# Patient Record
Sex: Male | Born: 1940 | Race: White | Hispanic: No | Marital: Married | Smoking: Former smoker
Health system: Southern US, Community
[De-identification: ages and names within clinical notes are randomized; demographics above are authoritative.]

## PROBLEM LIST (undated history)

## (undated) DIAGNOSIS — G459 Transient cerebral ischemic attack, unspecified: Secondary | ICD-10-CM

## (undated) DIAGNOSIS — I639 Cerebral infarction, unspecified: Secondary | ICD-10-CM

## (undated) DIAGNOSIS — R569 Unspecified convulsions: Secondary | ICD-10-CM

## (undated) DIAGNOSIS — I619 Nontraumatic intracerebral hemorrhage, unspecified: Secondary | ICD-10-CM

## (undated) DIAGNOSIS — E119 Type 2 diabetes mellitus without complications: Secondary | ICD-10-CM

## (undated) DIAGNOSIS — I519 Heart disease, unspecified: Secondary | ICD-10-CM

## (undated) HISTORY — PX: CHOLECYSTECTOMY: SHX55

## (undated) HISTORY — DX: Type 2 diabetes mellitus without complications: E11.9

## (undated) HISTORY — DX: Nontraumatic intracerebral hemorrhage, unspecified: I61.9

## (undated) HISTORY — DX: Heart disease, unspecified: I51.9

## (undated) HISTORY — DX: Cerebral infarction, unspecified: I63.9

## (undated) HISTORY — DX: Transient cerebral ischemic attack, unspecified: G45.9

## (undated) HISTORY — DX: Unspecified convulsions: R56.9

---

## 1980-01-18 HISTORY — PX: CEREBRAL ANEURYSM REPAIR: SHX164

## 1994-01-17 HISTORY — PX: AORTIC VALVE REPLACEMENT (AVR)/CORONARY ARTERY BYPASS GRAFTING (CABG): SHX5725

## 1996-01-18 HISTORY — PX: CORONARY ANGIOPLASTY WITH STENT PLACEMENT: SHX49

## 2015-12-22 ENCOUNTER — Encounter: Payer: Self-pay | Admitting: Neurology

## 2015-12-22 ENCOUNTER — Ambulatory Visit (INDEPENDENT_AMBULATORY_CARE_PROVIDER_SITE_OTHER): Payer: Medicare Other | Admitting: Neurology

## 2015-12-22 VITALS — BP 150/82 | HR 60 | Ht 70.0 in | Wt 203.5 lb

## 2015-12-22 DIAGNOSIS — Z5181 Encounter for therapeutic drug level monitoring: Secondary | ICD-10-CM

## 2015-12-22 DIAGNOSIS — R4701 Aphasia: Secondary | ICD-10-CM | POA: Diagnosis not present

## 2015-12-22 NOTE — Progress Notes (Signed)
Reason for visit: Episodic aphasia  Referring physician: Dr. Leone Brand is a 75 y.o. male  History of present illness:  Mr. Enyeart is a 75 year old right-handed white male with a history of a cerebral hemorrhage requiring surgery around 1982. The patient has a surgical clip in place and he cannot undergo MRI brain evaluation. The patient has begun having episodes of stereotyped aphasia events that began in January 2017. The prior medical records from [place] were reviewed. He was admitted initially on January 6, again on July 26 and again on October 19 for episodes where he was unable to speak, he looked wide eyed, drooling, and stiff in the extremities. The patient is able to understand what is going on during the events, but he has a sensation of impending doom. The patient feels lack of control of the body and has black spots in front of the vision. The episode will last about an hour with recovery. The patient has had some general decline in his functional level since January 2017. 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 A 2-D echocardiogram showed an ejection fraction greater than 55% without a source of cardiogenic embolus. An EEG study done on 08/12/2015 showed right temporal sharp wave activity. The patient has been on Keppra which she could not tolerate and then was switched to Dilantin. A Dilantin level has not been checked at a 300 mg daily dose. 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 He has noted no new numbness or weakness. He is sent to this office or an evaluation. He has a chronic gait disorder, he will fall on occasion, uses a walker  for ambulation.  Past Medical History:  Diagnosis Date  . Cerebral hemorrhage (HCC)   . Diabetes (HCC)   . Heart disease   . TIA (transient ischemic attack)     Past Surgical History:  Procedure Laterality Date  . 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 CEREBRAL ANEURYSM REPAIR  1982  . CHOLECYSTECTOMY    . CORONARY ANGIOPLASTY WITH STENT PLACEMENT  1998    Family History  Problem Relation Age of Onset  . Hodgkin's lymphoma Mother   . Stroke Father     Social history:  reports that he quit smoking about 21 years ago. He has never used smokeless tobacco. He reports that he does not drink alcohol or use drugs.  Medications:  Prior to Admission medications   Medication Sig Start Date End Date Taking? Authorizing Provider  amLODipine (NORVASC) 10 MG tablet Take by mouth.   Yes Historical Provider, MD  aspirin EC 81 MG tablet Take by mouth.   Yes Historical Provider, MD  Blood Glucose Monitoring Suppl DEVI ACCU CHEK AVIVA PLUS METER   Dx E11.9 03/11/15  Yes Historical Provider, MD  clindamycin (CLEOCIN) 300 MG capsule Take by mouth.   Yes Historical Provider, MD  clopidogrel (PLAVIX) 75 MG tablet Take 1 tablet by mouth  every day 10/12/15  Yes Historical Provider, MD  gabapentin (NEURONTIN) 100 MG capsule  12/14/15  Yes Historical Provider, MD  Glucose Blood (BLOOD GLUCOSE TEST STRIPS) STRP ACCU CHEK AVIVA  TEST STRIPS USE TID Use as directed   Dx E11.9 03/11/15 03/10/16 Yes Historical Provider, MD  HUMULIN 70/30 (70-30) 100 UNIT/ML injection  11/27/15  Yes Historical Provider, MD  HYDROcodone-acetaminophen (NORCO) 10-325 MG tablet Take by mouth.   Yes Historical Provider, MD  Insulin Syringe-Needle U-100 (INSULIN SYRINGE .3CC/31GX5/16") 31G X 5/16" 0.3 ML MISC USE TWICE DAILY AS DIRECTED 11/26/15  Yes Historical Provider, MD  ipratropium (ATROVENT HFA) 17 MCG/ACT inhaler Inhale into the lungs.   Yes Historical Provider, MD  isosorbide  mononitrate (IMDUR) 60 MG 24 hr tablet Take by mouth.   Yes Historical Provider, MD  Lancets (ACCU-CHEK SOFT TOUCH) lancets Use TID as directed.   Dx E11.9 03/11/15 03/10/16 Yes Historical Provider, MD  meloxicam (MOBIC) 7.5 MG tablet Take by mouth.   Yes Historical Provider, MD  metoprolol tartrate (LOPRESSOR) 25 MG tablet Take by mouth.   Yes Historical Provider, MD  nitroGLYCERIN (NITROSTAT) 0.4 MG SL tablet Place under the tongue.   Yes Historical Provider, MD  phenytoin (DILANTIN) 100 MG ER capsule Take by mouth. 12/04/15 12/03/16 Yes Historical Provider, MD  potassium chloride (K-DUR) 10 MEQ tablet Take by mouth.   Yes Historical Provider, MD  pravastatin (PRAVACHOL) 40 MG tablet Take by mouth.   Yes Historical Provider, MD  ranitidine (ZANTAC) 150 MG tablet Take 1 tablet by mouth  twice a day 10/12/15  Yes Historical Provider, MD  tamsulosin (FLOMAX) 0.4 MG CAPS capsule Take 1 capsule by mouth  once a day 10/12/15  Yes Historical Provider, MD  traZODone (DESYREL) 50 MG tablet Take 2 tablets by mouth  every night at bedtime 10/12/15  Yes Historical Provider, MD  trolamine salicylate (ASPERCREME) 10 % cream Apply topically.   Yes Historical Provider, MD     No Known Allergies  ROS:  Out of a complete 14 system review of symptoms, the patient complains only of the following symptoms, and all other reviewed systems are negative.  Weight loss, fatigue Ringing in the ears, trouble swallowing Shortness of breath Incontinence of bowel and bladder, diarrhea Easy bruising, easy bleeding Feeling hot, increased thirst Joint pain Memory loss, confusion, headache, numbness, weakness, slurred speech, difficulty swallowing, seizure events Depression, anxiety, decreased energy, change in appetite, disinterest in activities  Blood pressure (!) 150/82, pulse 60, height 5\' 10"  (1.778 m), weight 203 lb 8 oz (92.3 kg).  Physical Exam  General: The patient is alert and cooperative at the time of the  examination. The patient is moderately obese  Eyes: Pupils are equal, round, and reactive to light. Discs are flat bilaterally.  Neck: The neck is supple, no carotid bruits are noted.  Respiratory: The respiratory examination is clear.  Cardiovascular: The cardiovascular examination reveals a regular rate and rhythm, no obvious murmurs or rubs are noted.  Skin: Extremities are without significant edema. There is some atrophy of the left arm and left leg, left arm is in flexion.  Neurologic Exam  Mental status: The patient is alert and oriented x 3 at the time of the examination. The patient has apparent normal recent and remote memory, with an apparently normal attention span and concentration ability.  Cranial nerves: Facial symmetry is not present. There is some depression of the left nasolabial fold. There is good sensation of the face to pinprick and soft touch on the right face, decreased on the left. The strength of the facial muscles and the muscles to head turning and shoulder shrug are normal bilaterally. Speech is well enunciated, no aphasia  or dysarthria is noted. Extraocular movements are full. Visual fields are full. The tongue is midline, and the patient has symmetric elevation of the soft palate. No obvious hearing deficits are noted.  Motor: The motor testing reveals 5 over 5 strength of the right extremities. On the left side, there is flexion of the left arm and elbow, flexion of the fingers. There is 4/5 strength with grip and with biceps and triceps function. The left lower extremity has some increased motor tone, normal or near-normal strength.  Sensory: Sensory testing is intact to pinprick, soft touch, vibration sensation, and position sense on the right extremities, with some decreased pinprick sensation on the left arm. Position sensation is somewhat decreased in both feet. No evidence of extinction is noted.  Coordination: Cerebellar testing reveals good  finger-nose-finger and heel-to-shin on the right, the patient has difficulty performing these tasks on the left arm and leg.  Gait and station: Gait is slightly wide-based, circumduction gait with the left leg, the patient walks with a walker with a slow deliberate gait. Tandem gait was not attempted Romberg is negative. No drift is seen.  Reflexes: Deep tendon reflexes are symmetric and normal bilaterally. Toes are downgoing on the right, upgoing on the left.   Assessment/Plan:  1. History of right intracranial hemorrhage, left hemiparesis and gait disorder  2. Episodic aphasia, possible seizures  The patient has had a multitude of events of aphasia; since being out of the hospital in October 2017, the patient has had 4 events at home, and he did not go to the hospital on the 12th and 30th of November, and the first and fourth of December. The patient has had a total of 7 such events this year. The events are identical from one episode to the other, increasing the likelihood that these do represent seizures. The patient has had an abnormal EEG evaluation. Blood work will be done today, if renal function allows, we will check a CT angiogram of the head and neck. The patient will have an EEG study repeated. Adjustments of the Dilantin may be done, or another seizure medication may be added to the Dilantin. He will follow-up in 3 months.  Marlan Palau. Keith Yusra Ravert MD 12/22/2015 2:48 PM  [facility] [address] [place] [id number]  Phone [phone] Fax [phone]

## 2015-12-22 NOTE — Patient Instructions (Signed)
   We will check blood work today and get an EEG study.

## 2015-12-23 ENCOUNTER — Telehealth: Payer: Self-pay | Admitting: Neurology

## 2015-12-23 DIAGNOSIS — G451 Carotid artery syndrome (hemispheric): Secondary | ICD-10-CM

## 2015-12-23 LAB — CBC WITH DIFFERENTIAL/PLATELET
BASOS ABS: 0 10*3/uL (ref 0.0–0.2)
Basos: 0 %
EOS (ABSOLUTE): 0.1 10*3/uL (ref 0.0–0.4)
Eos: 1 %
HEMOGLOBIN: 16.2 g/dL (ref 13.0–17.7)
Hematocrit: 47.6 % (ref 37.5–51.0)
Immature Grans (Abs): 0.1 10*3/uL (ref 0.0–0.1)
Immature Granulocytes: 1 %
LYMPHS ABS: 1.8 10*3/uL (ref 0.7–3.1)
Lymphs: 17 %
MCH: 30.7 pg (ref 26.6–33.0)
MCHC: 34 g/dL (ref 31.5–35.7)
MCV: 90 fL (ref 79–97)
MONOCYTES: 7 %
MONOS ABS: 0.7 10*3/uL (ref 0.1–0.9)
NEUTROS ABS: 7.5 10*3/uL — AB (ref 1.4–7.0)
Neutrophils: 74 %
Platelets: 227 10*3/uL (ref 150–379)
RBC: 5.28 x10E6/uL (ref 4.14–5.80)
RDW: 14.7 % (ref 12.3–15.4)
WBC: 10.2 10*3/uL (ref 3.4–10.8)

## 2015-12-23 LAB — COMPREHENSIVE METABOLIC PANEL
A/G RATIO: 1.9 (ref 1.2–2.2)
ALBUMIN: 4.5 g/dL (ref 3.5–4.8)
ALK PHOS: 96 IU/L (ref 39–117)
ALT: 13 IU/L (ref 0–44)
AST: 18 IU/L (ref 0–40)
BILIRUBIN TOTAL: 0.4 mg/dL (ref 0.0–1.2)
BUN / CREAT RATIO: 16 (ref 10–24)
BUN: 21 mg/dL (ref 8–27)
CHLORIDE: 102 mmol/L (ref 96–106)
CO2: 18 mmol/L (ref 18–29)
Calcium: 9 mg/dL (ref 8.6–10.2)
Creatinine, Ser: 1.33 mg/dL — ABNORMAL HIGH (ref 0.76–1.27)
GFR calc non Af Amer: 52 mL/min/{1.73_m2} — ABNORMAL LOW (ref >59)
GFR, EST AFRICAN AMERICAN: 60 mL/min/{1.73_m2} (ref >59)
GLOBULIN, TOTAL: 2.4 g/dL (ref 1.5–4.5)
Glucose: 209 mg/dL — ABNORMAL HIGH (ref 65–99)
POTASSIUM: 5.1 mmol/L (ref 3.5–5.2)
SODIUM: 136 mmol/L (ref 134–144)
TOTAL PROTEIN: 6.9 g/dL (ref 6.0–8.5)

## 2015-12-23 LAB — PHENYTOIN LEVEL, TOTAL: PHENYTOIN (DILANTIN), SERUM: 2.6 ug/mL — AB (ref 10.0–20.0)

## 2015-12-23 MED ORDER — PHENYTOIN SODIUM EXTENDED 100 MG PO CAPS: 200.0000 mg | ORAL_CAPSULE | Freq: Two times a day (BID) | ORAL | 3 refills | Status: AC

## 2015-12-23 NOTE — Telephone Encounter (Signed)
I called patient. I talk with the wife. The Dilantin level is low at 2.6. The dose will be increased taking 200 mg twice daily, I will call the patient in 2 weeks to recheck the Dilantin level. I will order a CT angiogram of the head and neck. The GFR is 52. No allergies to iodine or shellfish.  I will call in a prescription or the Dilantin to cover the increase dose.

## 2016-01-01 ENCOUNTER — Other Ambulatory Visit: Payer: Self-pay

## 2016-01-04 ENCOUNTER — Ambulatory Visit
Admission: RE | Admit: 2016-01-04 | Discharge: 2016-01-04 | Disposition: A | Discharge status: Home / Self Care | Payer: Medicare Other | Source: Ambulatory Visit | Attending: Neurology | Admitting: Neurology

## 2016-01-04 ENCOUNTER — Telehealth: Payer: Self-pay | Admitting: Neurology

## 2016-01-04 ENCOUNTER — Other Ambulatory Visit: Payer: Self-pay

## 2016-01-04 DIAGNOSIS — G451 Carotid artery syndrome (hemispheric): Secondary | ICD-10-CM

## 2016-01-04 IMAGING — CT CT ANGIO HEAD
1 of 10 series · 16 of 47 positions shown · IV contrast (isovue)
Comparison: None.

CLINICAL DATA: 75 y/o M; transient a aphasia. History of left-sided
stroke and cerebral aneurysm.

EXAM:
CT ANGIOGRAPHY HEAD AND NECK
TECHNIQUE: Multidetector CT imaging of the head and neck was performed using
the standard protocol during bolus administration of intravenous
contrast. Multiplanar CT image reconstructions and MIPs were
obtained to evaluate the vascular anatomy. Carotid stenosis
measurements (when applicable) are obtained utilizing NASCET
criteria, using the distal internal carotid diameter as the
denominator.
CONTRAST:  80 cc of Isovue 370.

[Series 10: thin · axial · 0.59mm/px · z∈[-650,-266]mm · 16 of 1434 slices shown]
[im 76/1434  brain]
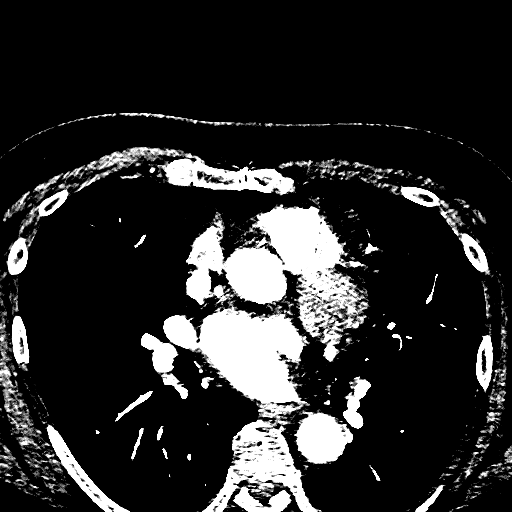
[im 151/1434  bone]
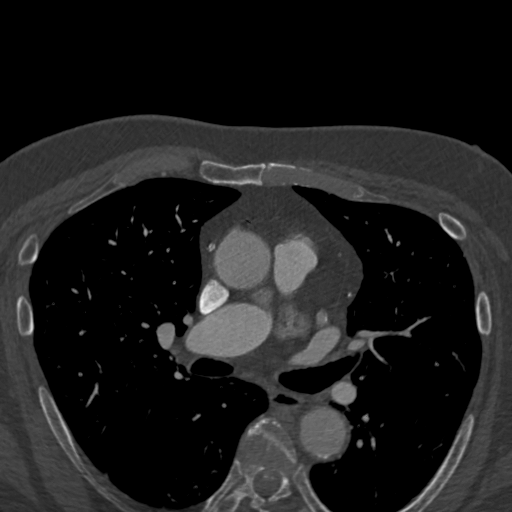
[im 227/1434  brain]
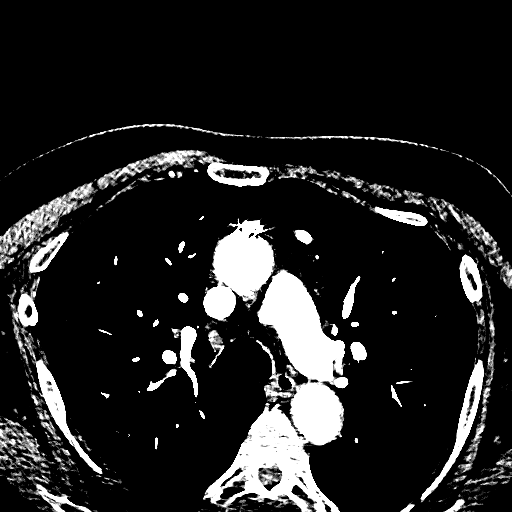
[im 302/1434  bone]
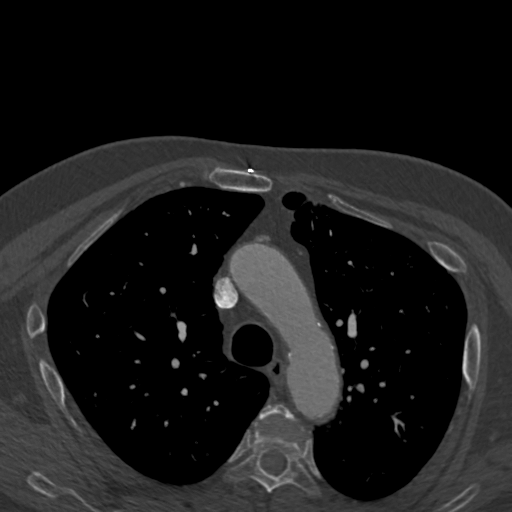
[im 453/1434  brain]
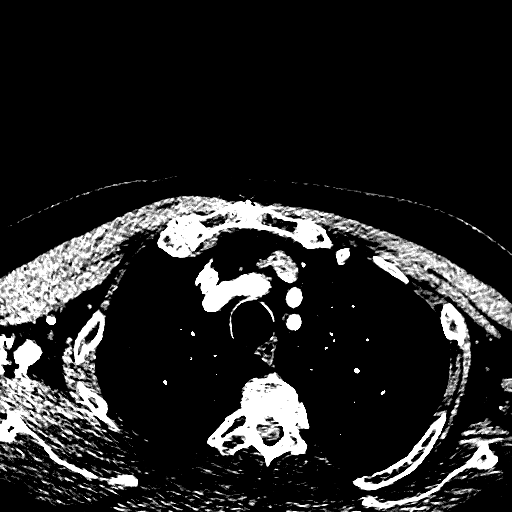
[im 528/1434  bone]
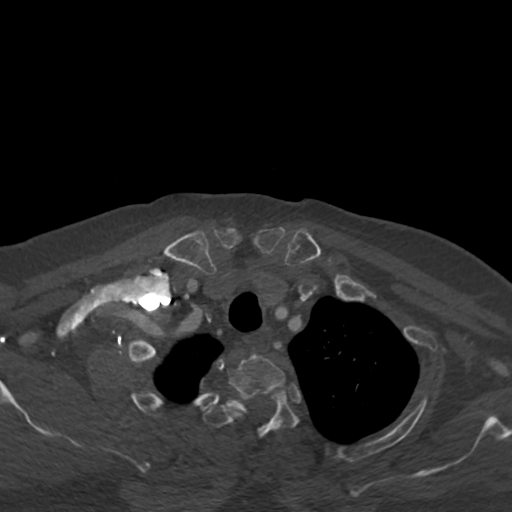
[im 604/1434  brain]
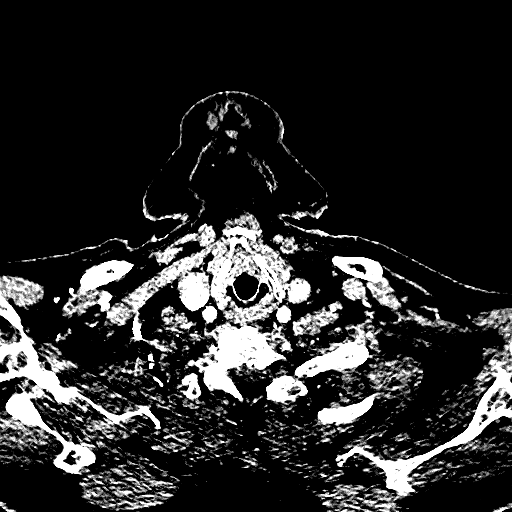
[im 679/1434  bone]
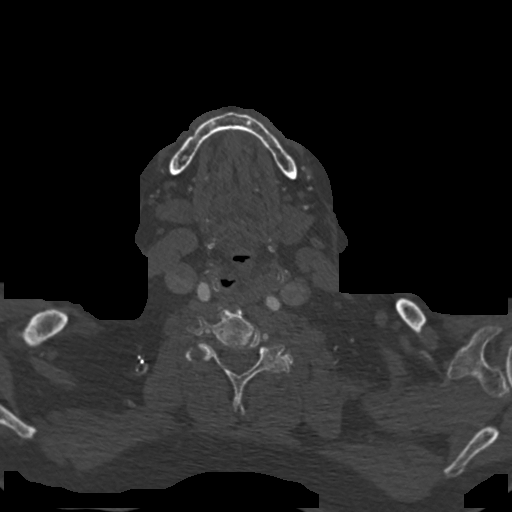
[im 755/1434  brain]
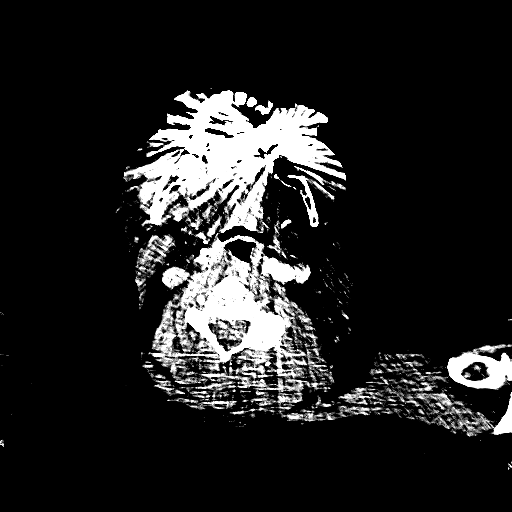
[im 830/1434  bone]
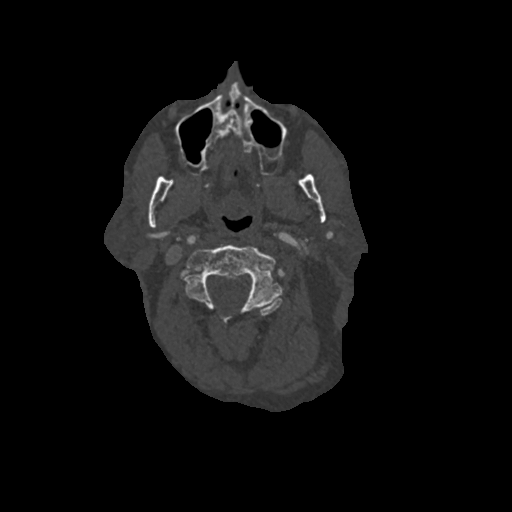
[im 906/1434  brain]
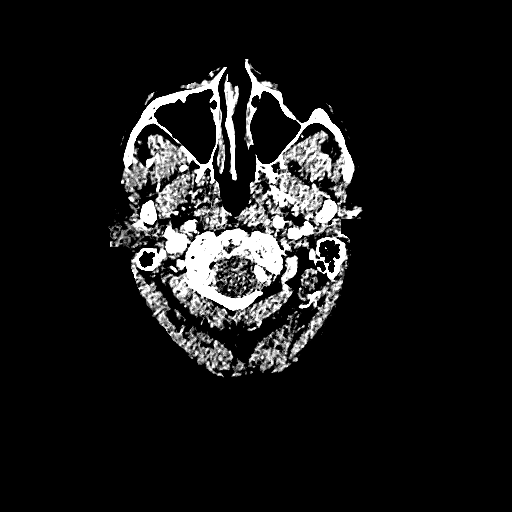
[im 981/1434  bone]
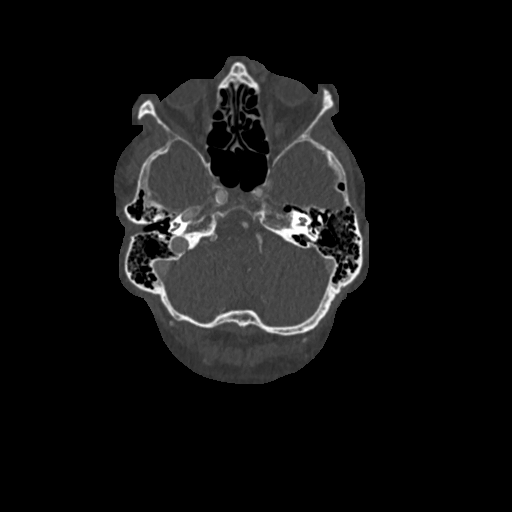
[im 1132/1434  brain]
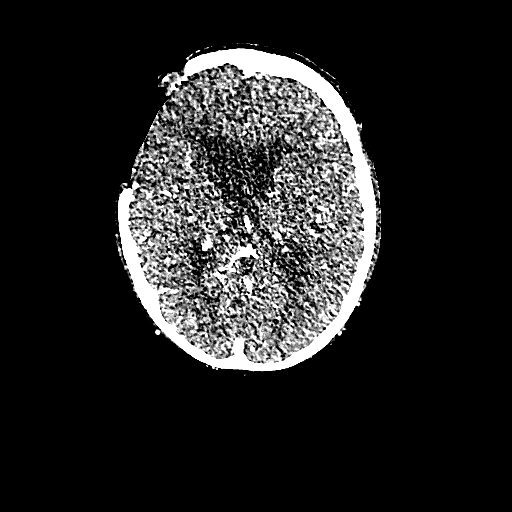
[im 1207/1434  bone]
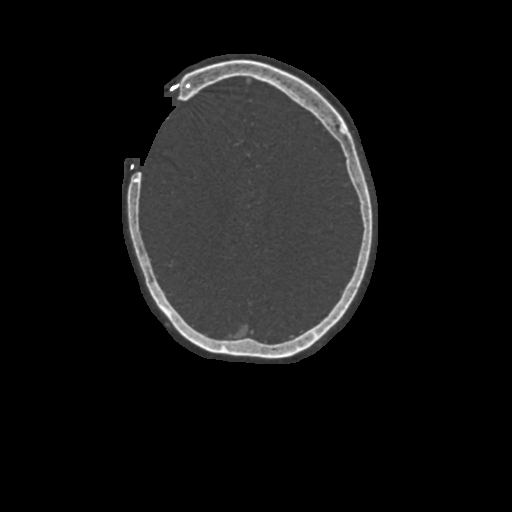
[im 1283/1434  brain]
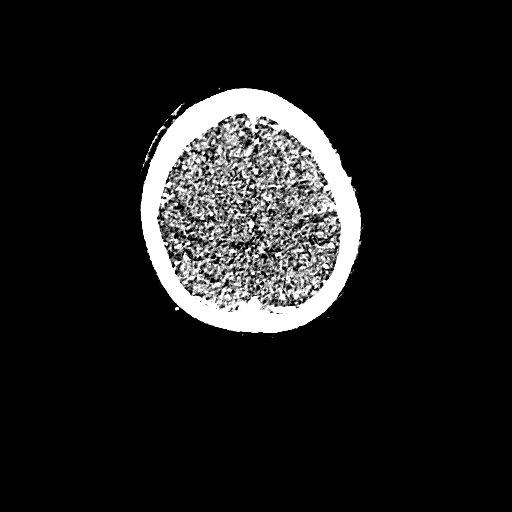
[im 1358/1434  bone]
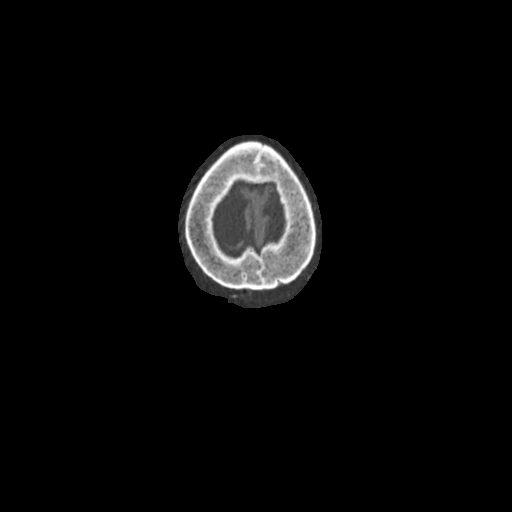

[16 of 47 positions shown; findings below may reference images not displayed]

FINDINGS: CT HEAD FINDINGS

Brain: Aneurysm clip in the right carotid terminus region.
Encephalomalacia within right anterior temporal and right inferior
frontal lobe probably representing combination ischemic and
postsurgical changes. Lucencies in bilateral lentiform nuclei and
may represent prominent perivascular spaces or old lacunar infarcts.
Scattered foci of hypoattenuation throughout subcortical and
periventricular white matter is compatible with moderate chronic
microvascular ischemic disease. Mild brain parenchymal volume loss.
No evidence of large acute infarct, focal mass effect, intracranial
hemorrhage, or hydrocephalus.

Vascular: As below.

Skull: Postsurgical changes related to right frontotemporal
cranioplasty.

Sinuses: Imaged portions are clear.

Orbits: No acute finding.

Review of the MIP images confirms the above findings

CTA NECK FINDINGS

Aortic arch: Normal caliber thoracic aorta. Bovine arch, normal
variant. Mild atherosclerosis with mixed plaque. No significant
stenosis of great vessel origins.

Right carotid system: No evidence of dissection or occlusion. Mixed
plaque at the carotid bifurcation with proximal 50% mild-to-moderate
ICA stenosis.

Left carotid system: No evidence of dissection, stenosis (50% or
greater) or occlusion. Predominantly calcified plaque of the carotid
bifurcation with minimal less than 30% stenosis.

Vertebral arteries: Codominant. No evidence of dissection, stenosis
(50% or greater) or occlusion.

Skeleton: Cervicothoracic spondylosis and extensive left-sided upper
cervical facet arthropathy. No high-grade bony canal stenosis.
Multiple levels of left-sided bony foraminal narrowing of the
cervical spine at the left C2 through C5 levels. New new

Other neck: Negative.

Upper chest: Right upper lobe 3 mm pulmonary nodule (series 9, image
109). Mild centrilobular emphysema. Healed median sternotomy the and
postsurgical changes related to CABG. Severe coronary artery
calcification. Partially visualized calcification of left ventricle
papillary muscles. Normal caliber main pulmonary artery.

Review of the MIP images confirms the above findings

CTA HEAD FINDINGS

Anterior circulation: No significant stenosis, proximal occlusion,
aneurysm, or vascular malformation. Right carotid terminus aneurysm
clipping. No residual aneurysm identified. Calcific atherosclerosis
of bilateral cavernous and paraclinoid internal carotid arteries
without significant stenosis.

Posterior circulation: No significant stenosis, proximal occlusion,
aneurysm, or vascular malformation.

Venous sinuses: As permitted by contrast timing, patent.

Anatomic variants: Large right A1 with diminutive left A1 and large
anterior communicating artery, normal variant. Fetal left PCA.

Delayed phase: No enhancement.

Review of the MIP images confirms the above findings
IMPRESSION: 1. Mixed plaque of right carotid bifurcation with proximal ICA 50%
mild-to-moderate stenosis.
2. Calcified plaque of left carotid bifurcation with minimal less
than 30% stenosis.
3. Codominant vertebral arteries without evidence of dissection,
stenosis, or aneurysm.
4. Patent circle of Willis without evidence of significant stenosis,
proximal occlusion, aneurysm, or vascular malformation
5. Right carotid terminus aneurysm clip. No residual aneurysm
identified. Right frontotemporal cranioplasty and postsurgical/post
ischemic changes in the right inferior frontal and right anterior
temporal lobes.
6. No acute intracranial abnormality on noncontrast CT of head. No
abnormal enhancement of the brain.
7. Right upper lobe 3 mm pulmonary nodule. No follow-up needed if
patient is low-risk. Non-contrast chest CT can be considered in 12
months if patient is high-risk. This recommendation follows the
consensus statement: Guidelines for Management of Incidental
Pulmonary Nodules Detected on CT Images: From the [HOSPITAL]
8. Mild emphysema of the lung apices.

By: LOP M.D.

## 2016-01-04 MED ORDER — IOPAMIDOL (ISOVUE-370) INJECTION 76%
80.0000 mL | Freq: Once | INTRAVENOUS | Status: AC | PRN
  Administered 2016-01-04: 80 mL via INTRAVENOUS

## 2016-01-04 NOTE — Telephone Encounter (Signed)
I called patient. The blood vessel circumflex relation to the head is good, 50% of the right internal carotid artery, otherwise no significant blockages noted. The Dilantin dosing has been readjusted.   CTA results 01/04/16:  IMPRESSION: 1. Mixed plaque of right carotid bifurcation with proximal ICA 50% mild-to-moderate stenosis. 2. Calcified plaque of left carotid bifurcation with minimal less than 30% stenosis. 3. Codominant vertebral arteries without evidence of dissection, stenosis, or aneurysm. 4. Patent circle of Joeph Szatkowski without evidence of significant stenosis, proximal occlusion, aneurysm, or vascular malformation 5. Right carotid terminus aneurysm clip. No residual aneurysm identified. Right frontotemporal cranioplasty and postsurgical/post ischemic changes in the right inferior frontal and right anterior temporal lobes. 6. No acute intracranial abnormality on noncontrast CT of head. No abnormal enhancement of the brain. 7. Right upper lobe 3 mm pulmonary nodule. No follow-up needed if patient is low-risk. Non-contrast chest CT can be considered in 12 months if patient is high-risk.

## 2016-01-06 ENCOUNTER — Telehealth: Payer: Self-pay | Admitting: Neurology

## 2016-01-06 DIAGNOSIS — Z5181 Encounter for therapeutic drug level monitoring: Secondary | ICD-10-CM

## 2016-01-06 NOTE — Telephone Encounter (Signed)
I called the patient, talk with the wife. The patient will come in for Dilantin level check sometime in early January.

## 2016-02-02 ENCOUNTER — Ambulatory Visit (INDEPENDENT_AMBULATORY_CARE_PROVIDER_SITE_OTHER): Payer: Self-pay

## 2016-02-02 ENCOUNTER — Other Ambulatory Visit (INDEPENDENT_AMBULATORY_CARE_PROVIDER_SITE_OTHER): Payer: Medicare Other

## 2016-02-02 DIAGNOSIS — R410 Disorientation, unspecified: Secondary | ICD-10-CM | POA: Diagnosis not present

## 2016-02-02 DIAGNOSIS — R4701 Aphasia: Secondary | ICD-10-CM

## 2016-02-02 DIAGNOSIS — Z0289 Encounter for other administrative examinations: Secondary | ICD-10-CM

## 2016-02-02 DIAGNOSIS — Z5181 Encounter for therapeutic drug level monitoring: Secondary | ICD-10-CM

## 2016-02-02 NOTE — Procedures (Signed)
     History: Reginald Christian is a 76 year old gentleman with a history of a right brain intracranial hemorrhage requiring surgical therapy in 1982. The patient has had a chronic left hemiparesis. The patient has had episodes of inability to speak, altered mental status. The patient may have a sensation of impending doom with these events. The patient has had a gradual decline in functional level since January 2017. Prior EEG studies have shown right temporal sharp wave activity. The patient has had recent multiple falls. He is being evaluated for his altered mental status.  This is a routine EEG. No skull defects are noted. Medications include amlodipine, aspirin, Plavix, insulin, Imdur, Dilantin, nitroglycerin, metoprolol, potassium supplementation, pravastatin, Flomax, and trazodone.  EEG classification: Dysrhythmia grade 1 generalized, maximum right hemisphere  Description of the recording: The background rhythms of this recording consists of a poorly modulated 6-7 Hz background activity that is less well organized in the right hemisphere than the left. The right background slowing is averaging around 6 Hz, the left hemisphere around 7 Hz. Photic stimulation is performed, this results in a minimal bilateral photic driving response. Hyperventilation was not performed. At no time during the recording does there appear to be evidence of spike or spike-wave discharges. EKG monitor shows no evidence of cardiac rhythm abnormalities with a heart rate of 66.  Impression: This is an abnormal EEG recording secondary to diffuse background slowing that is more prominent on the right hemisphere than the left. The study suggests a generalized toxic or metabolic encephalopathy with more focal right brain pathology. No clear epileptiform discharges are seen.

## 2016-02-02 NOTE — Progress Notes (Signed)
The results of the EEG study were discussed with the wife. The patient has come in for a Dilantin level to be checked today.

## 2016-02-03 ENCOUNTER — Telehealth: Payer: Self-pay | Admitting: Neurology

## 2016-02-03 DIAGNOSIS — R269 Unspecified abnormalities of gait and mobility: Secondary | ICD-10-CM

## 2016-02-03 LAB — PHENYTOIN LEVEL, TOTAL: PHENYTOIN (DILANTIN), SERUM: 10.3 ug/mL (ref 10.0–20.0)

## 2016-02-03 NOTE — Telephone Encounter (Signed)
I called the patient, talk with the wife. The Dilantin level is in the low therapeutic range, wife indicates that the patient will have left sided jerking at night lasting 1-2 minutes. We will change the dosing to 100 mg in the morning and 300 mg at night. He will stop the gabapentin.  The patient has not had any further episodes of aphasia, prior EEG study showed right temporal sharp wave activity. The patient has had difficulty walking since the increase in Dilantin dosing. The patient likely has underlying dementia, what the wife describes sounds like and apraxia issue with walking.  We will get home health physical therapy to help with walking.

## 2016-02-05 NOTE — Telephone Encounter (Signed)
Referral [facility] to [facility].

## 2016-02-09 ENCOUNTER — Telehealth: Payer: Self-pay | Admitting: Neurology

## 2016-02-09 NOTE — Telephone Encounter (Signed)
Wynn MaudlinFYI- Zach with [facility] called to update patient will be starting services with them on Friday requesting by patients wife.

## 2016-03-14 ENCOUNTER — Encounter: Payer: Self-pay | Admitting: *Deleted

## 2016-03-14 NOTE — Progress Notes (Signed)
Faxed signed [facility] to [facility] for extension of PT for 2x2 week effective 03/13/16 to improve LE strength, improve transfer and ambulation ability using walker. Fax:[phone]. Received confirmation.

## 2016-03-15 NOTE — Progress Notes (Signed)
Re-faxed signed/dated order again per request. Fax: [phone]. Received confirmation.

## 2016-04-01 ENCOUNTER — Encounter: Payer: Self-pay | Admitting: Neurology

## 2016-04-01 ENCOUNTER — Ambulatory Visit (INDEPENDENT_AMBULATORY_CARE_PROVIDER_SITE_OTHER): Payer: Medicare Other | Admitting: Neurology

## 2016-04-01 ENCOUNTER — Encounter (INDEPENDENT_AMBULATORY_CARE_PROVIDER_SITE_OTHER): Payer: Self-pay

## 2016-04-01 VITALS — BP 130/64 | HR 85

## 2016-04-01 DIAGNOSIS — R4701 Aphasia: Secondary | ICD-10-CM | POA: Diagnosis not present

## 2016-04-01 NOTE — Progress Notes (Signed)
Reason for visit: Episodic aphasia  Reginald Christian is an 76 y.o. male  History of present illness:  Mr. 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 The patient has had episodes of aphasia and body stiffening and confusion that are felt secondary to seizures. He has had an EEG study that showed generalized slowing, more prominent on the right hemisphere. No clear epileptiform discharges were seen. The patient has been placed on Keppra, but he could not tolerate the medication, he has been on Dilantin and he is doing well with this medication, he has not had any recurrence of aphasia on a 400 mg daily dose. The patient has started having diarrhea, they are not sure whether this is related to the Dilantin or not. The patient has had 3 falls since last seen. He is having daily episodes of diarrhea. He will have 4 or 5 bowel movements daily. He has not yet been evaluated for this issue. The patient himself otherwise claims that he feels better now than he has in a long time.  Past Medical History:  Diagnosis Date  . Cerebral hemorrhage (HCC)   . Diabetes (HCC)   . Heart disease   . Seizures (HCC)   . Stroke (HCC)   . TIA (transient ischemic attack)     Past Surgical History:  Procedure Laterality Date  . 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 CEREBRAL ANEURYSM REPAIR  1982  . CHOLECYSTECTOMY    . CORONARY ANGIOPLASTY WITH STENT PLACEMENT  1998    Family History  Problem Relation Age of Onset  . Hodgkin's lymphoma Mother   . Stroke Father     Social history:  reports that he quit smoking about 22 years ago. He has never used smokeless tobacco. He reports that he does not drink alcohol or use drugs.    Allergies  Allergen Reactions  . Tegretol [Carbamazepine]     Joint swelling    Medications:  Prior to Admission medications   Medication Sig  Start Date End Date Taking? Authorizing Provider  amLODipine (NORVASC) 10 MG tablet Take by mouth.   Yes Historical Provider, MD  aspirin EC 81 MG tablet Take by mouth.   Yes Historical Provider, MD  Blood Glucose Monitoring Suppl DEVI ACCU CHEK AVIVA PLUS METER   Dx E11.9 03/11/15  Yes Historical Provider, MD  clindamycin (CLEOCIN) 300 MG capsule Take by mouth.   Yes Historical Provider, MD  clopidogrel (PLAVIX) 75 MG tablet Take 1 tablet by mouth  every day 10/12/15  Yes Historical Provider, MD  finasteride (PROSCAR) 5 MG tablet Take 5 mg by mouth daily.   Yes Historical Provider, MD  gabapentin (NEURONTIN) 100 MG capsule  12/14/15  Yes Historical Provider, MD  HUMULIN 70/30 (70-30) 100 UNIT/ML injection  11/27/15  Yes Historical Provider, MD  HYDROcodone-acetaminophen (NORCO) 10-325 MG tablet Take by mouth.   Yes Historical Provider, MD  Insulin Syringe-Needle U-100 (INSULIN SYRINGE .3CC/31GX5/16") 31G X 5/16" 0.3 ML MISC USE TWICE DAILY AS DIRECTED 11/26/15  Yes Historical Provider, MD  ipratropium (ATROVENT HFA) 17 MCG/ACT inhaler Inhale into the lungs.   Yes Historical Provider, MD  isosorbide mononitrate (IMDUR) 60 MG 24 hr tablet Take by mouth.   Yes Historical Provider, MD  meloxicam (MOBIC) 7.5 MG tablet Take by mouth.   Yes Historical Provider, MD  metoprolol tartrate (LOPRESSOR) 25 MG tablet  Take by mouth.   Yes Historical Provider, MD  nitroGLYCERIN (NITROSTAT) 0.4 MG SL tablet Place under the tongue.   Yes Historical Provider, MD  phenytoin (DILANTIN) 100 MG ER capsule Take 2 capsules (200 mg total) by mouth 2 (two) times daily. Patient taking differently: Take 200 mg by mouth 2 (two) times daily.  12/23/15  Yes York Spanielharles K Willis, MD  potassium chloride (K-DUR) 10 MEQ tablet Take by mouth.   Yes Historical Provider, MD  pravastatin (PRAVACHOL) 40 MG tablet Take by mouth.   Yes Historical Provider, MD  ranitidine (ZANTAC) 150 MG tablet Take 1 tablet by mouth  twice a day 10/12/15  Yes  Historical Provider, MD  tamsulosin (FLOMAX) 0.4 MG CAPS capsule Take 1 capsule by mouth  once a day 10/12/15  Yes Historical Provider, MD  traZODone (DESYREL) 50 MG tablet Take 2 tablets by mouth  every night at bedtime 10/12/15  Yes Historical Provider, MD  trolamine salicylate (ASPERCREME) 10 % cream Apply topically.   Yes Historical Provider, MD    ROS:  Out of a complete 14 system review of symptoms, the patient complains only of the following symptoms, and all other reviewed systems are negative.  Walking difficulty Weakness  Blood pressure 130/64, pulse 85.  Physical Exam  General: The patient is alert and cooperative at the time of the examination.  Skin: No significant peripheral edema is noted.   Neurologic Exam  Mental status: The patient is alert and oriented x 3 at the time of the examination. The patient has apparent normal recent and remote memory, with an apparently normal attention span and concentration ability.   Cranial nerves: Facial symmetry is present. Speech is aphasic, not dysarthric. Extraocular movements are full. Visual fields are full.  Motor: The patient has good strength in the right extremities. The patient has flexion of the left arm, weakness with the left grip and with elevation of the left arm. The patient has 4/5 strength of the left leg.  Sensory examination: Soft touch sensation is symmetric on the face, arms, and legs.  Coordination: The patient has good finger-nose-finger and heel-to-shin on the right, difficulty performing on the left.  Gait and station: The patient is able to ambulate with assistance short distances, he has a circumduction gait with the left leg. Romberg is negative.  Reflexes: Deep tendon reflexes are symmetric.   Assessment/Plan:  1. History of right brain intracranial hemorrhage, left hemiparesis  2. Episodes of aphasia, probable seizures  The patient seems to be doing well with the Dilantin so far, but he has  having diarrhea. It is not clear that the medication itself is causing this, he has been on Dilantin in the past and has done well with it. He has not undergone a gastroenterological evaluation. The patient drinks a lot of milk, I have recommended that they try Lactaid first, if the diarrhea continues, they will need to see a gastroenterologist. He otherwise will follow-up in 6 months.  Marlan Palau. Keith Willis MD 04/01/2016 11:57 AM  [facility] [address] [place], [place] [id number]  Phone [phone] Fax [phone]

## 2016-10-06 ENCOUNTER — Ambulatory Visit: Payer: Medicare Other | Admitting: Neurology

## 2016-10-07 ENCOUNTER — Ambulatory Visit: Payer: Medicare Other | Admitting: Neurology

## 2021-07-17 DEATH — deceased
